# Patient Record
Sex: Male | Born: 1960 | Race: White | Hispanic: No | Marital: Married | Smoking: Never smoker
Health system: Southern US, Community
[De-identification: ages and names within clinical notes are randomized; demographics above are authoritative.]

## PROBLEM LIST (undated history)

## (undated) DIAGNOSIS — K259 Gastric ulcer, unspecified as acute or chronic, without hemorrhage or perforation: Secondary | ICD-10-CM

## (undated) DIAGNOSIS — I10 Essential (primary) hypertension: Secondary | ICD-10-CM

## (undated) DIAGNOSIS — R51 Headache: Secondary | ICD-10-CM

## (undated) DIAGNOSIS — K219 Gastro-esophageal reflux disease without esophagitis: Secondary | ICD-10-CM

## (undated) DIAGNOSIS — G8929 Other chronic pain: Secondary | ICD-10-CM

## (undated) DIAGNOSIS — G709 Myoneural disorder, unspecified: Secondary | ICD-10-CM

## (undated) DIAGNOSIS — F419 Anxiety disorder, unspecified: Secondary | ICD-10-CM

## (undated) DIAGNOSIS — K2970 Gastritis, unspecified, without bleeding: Secondary | ICD-10-CM

## (undated) DIAGNOSIS — M1990 Unspecified osteoarthritis, unspecified site: Secondary | ICD-10-CM

## (undated) DIAGNOSIS — N200 Calculus of kidney: Secondary | ICD-10-CM

## (undated) HISTORY — DX: Gastric ulcer, unspecified as acute or chronic, without hemorrhage or perforation: K25.9

## (undated) HISTORY — DX: Unspecified osteoarthritis, unspecified site: M19.90

## (undated) HISTORY — DX: Other chronic pain: G89.29

## (undated) HISTORY — DX: Gastritis, unspecified, without bleeding: K29.70

## (undated) HISTORY — DX: Gastro-esophageal reflux disease without esophagitis: K21.9

## (undated) HISTORY — PX: LAMINECTOMY: SHX219

## (undated) HISTORY — DX: Headache: R51

## (undated) HISTORY — DX: Calculus of kidney: N20.0

## (undated) HISTORY — DX: Anxiety disorder, unspecified: F41.9

## (undated) HISTORY — DX: Myoneural disorder, unspecified: G70.9

## (undated) HISTORY — DX: Essential (primary) hypertension: I10

---

## 1999-10-19 ENCOUNTER — Encounter: Admission: RE | Admit: 1999-10-19 | Discharge: 1999-10-19 | Payer: Self-pay | Admitting: Sports Medicine

## 2007-08-28 ENCOUNTER — Ambulatory Visit: Payer: Self-pay | Admitting: Internal Medicine

## 2008-03-08 ENCOUNTER — Ambulatory Visit: Payer: Self-pay | Admitting: Internal Medicine

## 2008-03-14 ENCOUNTER — Ambulatory Visit: Payer: Self-pay | Admitting: Internal Medicine

## 2008-03-14 ENCOUNTER — Encounter: Payer: Self-pay | Admitting: Internal Medicine

## 2008-03-18 ENCOUNTER — Encounter: Payer: Self-pay | Admitting: Internal Medicine

## 2010-12-01 NOTE — Assessment & Plan Note (Signed)
[facility]                         GASTROENTEROLOGY OFFICE NOTE   NAME:Pender, LORI LIEW                 MRN:          [id number]  DATE:08/28/2007                            DOB:          04-Oct-1960    Mr. Faucett is a 50 year old gentleman whom we saw in the past for  evaluation of gastric ulcer in 1979, and family history of colon cancer  in his grandfather and great-grandfather, as well as esophageal cancer  in his father who was our patient.  Mr. Chicoine has intermittent rectal  bleeding.  He has seen bright red blood on the tissue intermittently  without any significant rectal pain.  His upper endoscopy in September  2004 was normal.  Biopsies of the G junction showed no evidence of  Barrett's esophagus.  He was treated with PPIs at that time, which he  has since stopped taking because there are no symptoms.  As far as his  colonoscopy is concerned, this was a normal exam without any polyps.  I  did not at that time comment on the presence of any anal fissure or  hemorrhoids.  His bowel habits have been regular.  He denies use of  laxatives.  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  This results in him sitting  quite a lot.  His weight has been stable.   MEDICATIONS:  Multiple vit mains.   PAST MEDICAL HISTORY:  Chronic headaches.   OPERATIONS:  Laminectomy and foraminectomy in 2007 and in 1990.   FAMILY HISTORY:  Positive for colon cancer as mentioned above, heart  disease and diabetes.   SOCIAL HISTORY:  Married with two children.  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  He does not smoke and drinks alcohol  only occasionally.   REVIEW OF SYSTEMS:  Positive for arthritic complaints, sleeping  problems, back pain.   PHYSICAL EXAMINATION:  Blood pressure 100/70, pulse 60 and weight  197  pounds.  He was alert, oriented, in no distress.  LUNGS:  Clear to auscultation  COR:  Normal S1, S2.  ABDOMEN:  Soft, nontender with normoactive bowel sounds.  RECTAL:  Anoscopic exam shows normal perineal area.  He had normal  rectal tone and first grade internal hemorrhoids.  One of them was  bluish in discoloration.  Two of them  were rather small, but active.  There was no prolapse.  Stool was Hemoccult negative.   IMPRESSION:  [age]-year-old white male with symptomatic first grade  hemorrhoids, which are all internal.  He has a strong family history of  colorectal neoplasm, and had appropriate screening 4-1/2 years ago.  He  is due for repeat colonoscopy in September 2009.  We discussed treatment  of the hemorrhoids, which undoubtedly are affected by the fact that he  drives long distances.  I suggested using a pillow or special seat, or  getting out of the car at regular intervals to improve his circulation  around the rectal area.  We will be  using Anusol HC suppositories  intermittently.  He is due for colonoscopy in September 2009.  If at  that  time his hemorrhoids still bother him and keep bleeding, we will  consider banding them during the colonoscopic exam.     Hedwig Morton. Juanda Chance, MD  Electronically Signed    DMB/MedQ  DD: 08/28/2007  DT: 08/28/2007  Job #: [id number]   cc:   Foye Deer, M.D.

## 2012-04-07 ENCOUNTER — Emergency Department: Payer: Self-pay | Admitting: Internal Medicine

## 2012-04-07 LAB — COMPREHENSIVE METABOLIC PANEL
Albumin: 4.1 g/dL (ref 3.4–5.0)
Anion Gap: 5 — ABNORMAL LOW (ref 7–16)
BUN: 19 mg/dL — ABNORMAL HIGH (ref 7–18)
Bilirubin,Total: 1.4 mg/dL — ABNORMAL HIGH (ref 0.2–1.0)
Chloride: 105 mmol/L (ref 98–107)
Creatinine: 0.94 mg/dL (ref 0.60–1.30)
EGFR (African American): >60
Glucose: 95 mg/dL (ref 65–99)
SGOT(AST): 18 U/L (ref 15–37)
Sodium: 140 mmol/L (ref 136–145)
Total Protein: 7.4 g/dL (ref 6.4–8.2)

## 2012-04-07 LAB — TROPONIN I: Troponin-I: <0.02 ng/mL

## 2012-04-07 LAB — CBC: HGB: 15.1 g/dL (ref 13.0–18.0)

## 2012-04-07 LAB — TSH: Thyroid Stimulating Horm: 2.51 u[IU]/mL

## 2012-12-20 ENCOUNTER — Telehealth: Payer: Self-pay | Admitting: Internal Medicine

## 2012-12-20 NOTE — Telephone Encounter (Signed)
Left a message for patient to call me. 

## 2012-12-21 NOTE — Telephone Encounter (Signed)
Spoke with patient and he is wanting to schedule ECOL. Asked patient if he is having problems. He states he is having esophageal discomfort off and on. Also reports a history of hemorrhoids and rectal bleeding.He is not having increase in problems in this area. He is anxious due to family history of esophageal cancer and colon cancer. He travels a lot and wants to schedule OV to discuss. Scheduled OV with Dr. Juanda Chance on 01/05/13 at 3:00 PM.

## 2013-01-05 ENCOUNTER — Encounter: Payer: Self-pay | Admitting: Internal Medicine

## 2013-01-05 ENCOUNTER — Ambulatory Visit (INDEPENDENT_AMBULATORY_CARE_PROVIDER_SITE_OTHER): Payer: BC Managed Care – PPO | Admitting: Internal Medicine

## 2013-01-05 VITALS — BP 110/80 | HR 80 | Ht 68.0 in | Wt 211.1 lb

## 2013-01-05 DIAGNOSIS — R1013 Epigastric pain: Secondary | ICD-10-CM

## 2013-01-05 DIAGNOSIS — K625 Hemorrhage of anus and rectum: Secondary | ICD-10-CM

## 2013-01-05 MED ORDER — PEG-KCL-NACL-NASULF-NA ASC-C 100 G PO SOLR: 1.0000 | Freq: Once | ORAL | Status: DC

## 2013-01-05 MED ORDER — HYDROCORTISONE ACETATE 25 MG RE SUPP: 25.0000 mg | Freq: Two times a day (BID) | RECTAL | Status: DC

## 2013-01-05 MED ORDER — ESOMEPRAZOLE MAGNESIUM 40 MG PO CPDR: 40.0000 mg | DELAYED_RELEASE_CAPSULE | Freq: Every day | ORAL | Status: DC

## 2013-01-05 NOTE — Patient Instructions (Addendum)
You have been given a separate informational sheet regarding your tobacco use, the importance of quitting and local resources to help you quit.  You have been scheduled for an endoscopy and colonoscopy with propofol. Please follow the written instructions given to you at your visit today. Please pick up your prep at the pharmacy within the next 1-3 days. If you use inhalers (even only as needed), please bring them with you on the day of your procedure. Your physician has requested that you go to www.startemmi.com and enter the access code given to you at your visit today. This web site gives a general overview about your procedure. However, you should still follow specific instructions given to you by our office regarding your preparation for the procedure.  We have sent the following medications to your pharmacy for you to pick up at your convenience: Nexium and Anusol suppositories.  Cc: Dalbert Garnet, MD

## 2013-01-05 NOTE — Progress Notes (Signed)
Terry Perez 13-Nov-1960 MRN [id number]        History of Present Illness:  This is a 52 year old white male with chronic gastroesophageal reflux, currently not treated. And symptomatic hemorrhoids. We have seen him in the past for gastroesophageal reflux and  family history of colon cancer. In August 2009 he underwent upper endoscopy to evaluate cough and history of gastric ulcer from 1979. His father had the Barrett's esophagus and died of esophageal cancer. Marland Kitchen He also had a gastritis which on biopsy showed  chronic gastritis H. pylori negative. In August 2009 he also underwent colonoscopy. This was his second exam the first one was from 2004. He is a family history of colon cancer in grandparent and great grandparent. He sees occasional rectal bleeding but his bowel habits have been regular. He drives a long distances in a car up to several hundred miles a day which aggravates his hemorrhoids.    Past Medical History  Diagnosis Date  . Gastric ulcer   . Chronic headaches   . Gastritis   . Arthritis   . Kidney stones   . HTN (hypertension)    Past Surgical History  Procedure Laterality Date  . Laminectomy  2007, 1990    reports that he has never smoked. His smokeless tobacco use includes Snuff. He reports that he does not drink alcohol or use illicit drugs. family history includes Colon cancer in his maternal grandfather; Diabetes in his mother; Esophageal cancer in his father; Heart disease in his father; and Stroke in his paternal grandmother. No Known Allergies      Review of Systems: Denies dysphagia odynophagia. Positive for chest pain and epigastric discomfort  The remainder of the 10 point ROS is negative except as outlined in H&P   Physical Exam: General appearance  Well developed, in no distress. Mildly overweight Eyes- non icteric. HEENT nontraumatic, normocephalic. Mouth no lesions, tongue papillated, no cheilosis. Neck supple without adenopathy, thyroid  not enlarged, no carotid bruits, no JVD. Lungs Clear to auscultation bilaterally. Cor normal S1, normal S2, regular rhythm, no murmur,  quiet precordium. Abdomen: Tender in epigastrium and above the umbilicus in the midline. Normal active bowel sounds. No distention normal left lower and right lower quadrants. No palpable mass. Liver edge at costal margin Rectal: Normal perianal area. Normal rectal sphincter tone. Settle first grade internal hemorrhoids which are edematous hyperemic and appeared to be inflamed. There is no thrombosis. Stool is Hemoccult negative Extremities no pedal edema. Skin no lesions. Neurological alert and oriented x 3. Psychological normal mood and affect.  Assessment and Plan:  52 year old white male with chronic gastroesophageal reflux and family history of Barrett's esophagus and gastric cancer. He needs to be back on acid reducing agent. He like Nexium 40 mg daily. Antireflux measures including weight loss. We will schedule for upper endoscopy and biopsies  Symptomatic first grade hemorrhoids. Will start Anusol-HC suppositories each bedtime  Family history of colorectal cancer in several relatives. We will schedule for screening colonoscopy because of rectal bleeding   01/05/2013 Terry Perez

## 2013-01-08 ENCOUNTER — Encounter: Payer: Self-pay | Admitting: Internal Medicine

## 2013-02-14 ENCOUNTER — Ambulatory Visit (AMBULATORY_SURGERY_CENTER): Payer: BC Managed Care – PPO | Admitting: Internal Medicine

## 2013-02-14 ENCOUNTER — Encounter: Payer: Self-pay | Admitting: Internal Medicine

## 2013-02-14 VITALS — BP 115/79 | HR 62 | Temp 98.3°F | Resp 63 | Ht 68.0 in | Wt 197.0 lb

## 2013-02-14 DIAGNOSIS — Z800 Family history of malignant neoplasm of digestive organs: Secondary | ICD-10-CM

## 2013-02-14 DIAGNOSIS — K2970 Gastritis, unspecified, without bleeding: Secondary | ICD-10-CM

## 2013-02-14 DIAGNOSIS — R1013 Epigastric pain: Secondary | ICD-10-CM

## 2013-02-14 DIAGNOSIS — Z1211 Encounter for screening for malignant neoplasm of colon: Secondary | ICD-10-CM

## 2013-02-14 DIAGNOSIS — K2960 Other gastritis without bleeding: Secondary | ICD-10-CM

## 2013-02-14 DIAGNOSIS — K209 Esophagitis, unspecified without bleeding: Secondary | ICD-10-CM

## 2013-02-14 DIAGNOSIS — K2990 Gastroduodenitis, unspecified, without bleeding: Secondary | ICD-10-CM

## 2013-02-14 DIAGNOSIS — K625 Hemorrhage of anus and rectum: Secondary | ICD-10-CM

## 2013-02-14 MED ORDER — SODIUM CHLORIDE 0.9 % IV SOLN: 500.0000 mL | INTRAVENOUS | Status: DC

## 2013-02-14 NOTE — Op Note (Addendum)
[facility] [address] [place], [address]   COLONOSCOPY PROCEDURE REPORT  PATIENT: Terry Perez, Terry Perez  MR#: [id number] BIRTHDATE: Nov 23, 1960 , 52  yrs. old GENDER: Male ENDOSCOPIST: Hart Carwin, MD REFERRED [id number] Tomasa Blase, M.D. PROCEDURE DATE:  02/14/2013 PROCEDURE:   Colonoscopy, screening First Screening Colonoscopy - Avg.  risk and is 50 yrs.  old or older - No.  Prior Negative Screening - Now for repeat screening. Other: See Comments         no ASA CLASS:   Class II INDICATIONS:father with gastric cancer, from Barrett's, PGF had colon cancer, pt is having low volume hematochezia.  Last colonosobpy in 2009 ( and in 2004). MEDICATIONS: MAC sedation, administered by CRNA and Propofol (Diprivan) 150 mg  DESCRIPTION OF PROCEDURE:   After the risks benefits and alternatives of the procedure were thoroughly explained, informed consent was obtained.  A digital rectal exam revealed no abnormalities of the rectum.   The LB [id number]  endoscope was introduced through the anus and advanced to the cecum, which was identified by both the appendix and ileocecal valve. No adverse events experienced.   The quality of the prep was excellent, using MoviPrep  The instrument was then slowly withdrawn as the colon was fully examined.      COLON FINDINGS: A normal appearing cecum, ileocecal valve, and appendiceal orifice were identified.  The ascending, hepatic flexure, transverse, splenic flexure, descending, sigmoid colon and rectum appeared unremarkable.  No polyps or cancers were seen. Retroflexed views revealed no abnormalities. The time to cecum=  . Withdrawal time=  .  The scope was withdrawn and the procedure completed. COMPLICATIONS: There were no complications.  ENDOSCOPIC IMPRESSION: Normal colon to the cecum, low volume hematochezia likely due to anaorectal source  RECOMMENDATIONS: High fiber diet   eSigned:  Hart Carwin, MD  02/14/2013 2:41 PM   cc:

## 2013-02-14 NOTE — Patient Instructions (Addendum)

## 2013-02-14 NOTE — Progress Notes (Signed)
Called to room to assist during endoscopic procedure.  Patient ID and intended procedure confirmed with present staff. Received instructions for my participation in the procedure from the performing physician.  

## 2013-02-14 NOTE — Op Note (Signed)
[facility] [address] [place], [address]   ENDOSCOPY PROCEDURE REPORT  PATIENT: Terry Perez, Terry Perez  MR#: [id number] BIRTHDATE: [birth year] , 52  yrs. old GENDER: Male ENDOSCOPIST: Hart Carwin, MD REFERRED BY:  Foye Deer, M.D. PROCEDURE DATE:  02/14/2013 PROCEDURE:  EGD w/ biopsy ASA CLASS:     Class II INDICATIONS:  Epigastric pain.   father with Barrett's esophagus/ adenocarcinoma, , pt had gastric ulcer in 1979,, last EGD 02/2008- normal. MEDICATIONS: MAC sedation, administered by CRNA and propofol (Diprivan) 200mg  IV TOPICAL ANESTHETIC: none  DESCRIPTION OF PROCEDURE: After the risks benefits and alternatives of the procedure were thoroughly explained, informed consent was obtained.  The LB [id number] endoscope was introduced through the mouth and advanced to the second portion of the duodenum. Without limitations.  The instrument was slowly withdrawn as the mucosa was fully examined.      esophagus: Esophageal mucosa appeared normal in proximal , mid and distal esophagus. There were no erosions or evidence of active esophagitis. The Z line appeared normal. There was no stricture or hiatal hernia. Biopsies were taken from the Z line to rule out metaplasia Stomach: There was coffee-ground material coating gastric mucosa and a few scattered erosions throughout the stomach but no ulceration rugal pattern was normal. Biopsies from the gastric antrum were taken to rule out H. pylori. Retroflexion of the endoscope revealed normal fundus and cardia. The pyloric outlet was normal there was no retained food Duodenum: Duodenal bulb and descending duodenum appeared normal[          The scope was then withdrawn from the patient and the procedure completed.  COMPLICATIONS: There were no complications. ENDOSCOPIC IMPRESSION:  mild-to-moderate gastritis status post biopsies to rule out H. pylori Normal gastroesophageal junction status post  biopsies to rule out metaplasia. RECOMMENDATIONS: Anti-reflux regimen to be follow continue PPI daily Date results of pathology  REPEAT EXAM: no  eSigned:  Hart Carwin, MD 02/14/2013 2:34 PM   CC:  PATIENT NAME:  Terry Perez, Terry Perez MR#: [id number]

## 2013-02-14 NOTE — Progress Notes (Signed)
Lidocaine-40mg IV prior to Propofol InductionPropofol given over incremental dosages 

## 2013-02-14 NOTE — Progress Notes (Signed)
Patient did not experience any of the following events: a burn prior to discharge; a fall within the facility; wrong site/side/patient/procedure/implant event; or a hospital transfer or hospital admission upon discharge from the facility. (G8907) Patient did not have preoperative order for IV antibiotic SSI prophylaxis. (G8918)  

## 2013-02-15 ENCOUNTER — Telehealth: Payer: Self-pay | Admitting: *Deleted

## 2013-02-15 NOTE — Telephone Encounter (Signed)
No answer, message left for the patient. 

## 2013-02-21 ENCOUNTER — Encounter: Payer: Self-pay | Admitting: Internal Medicine

## 2014-01-22 IMAGING — CR DG CHEST 2V
1 series · 2 of 2 positions shown · non-contrast
Comparison: none

REASON FOR EXAM: palpitations recent pneumothorax
COMMENTS:

[Series 1: w chest pa · 0.14mm/px · 2 of 2 slices shown]
[im 1/2]
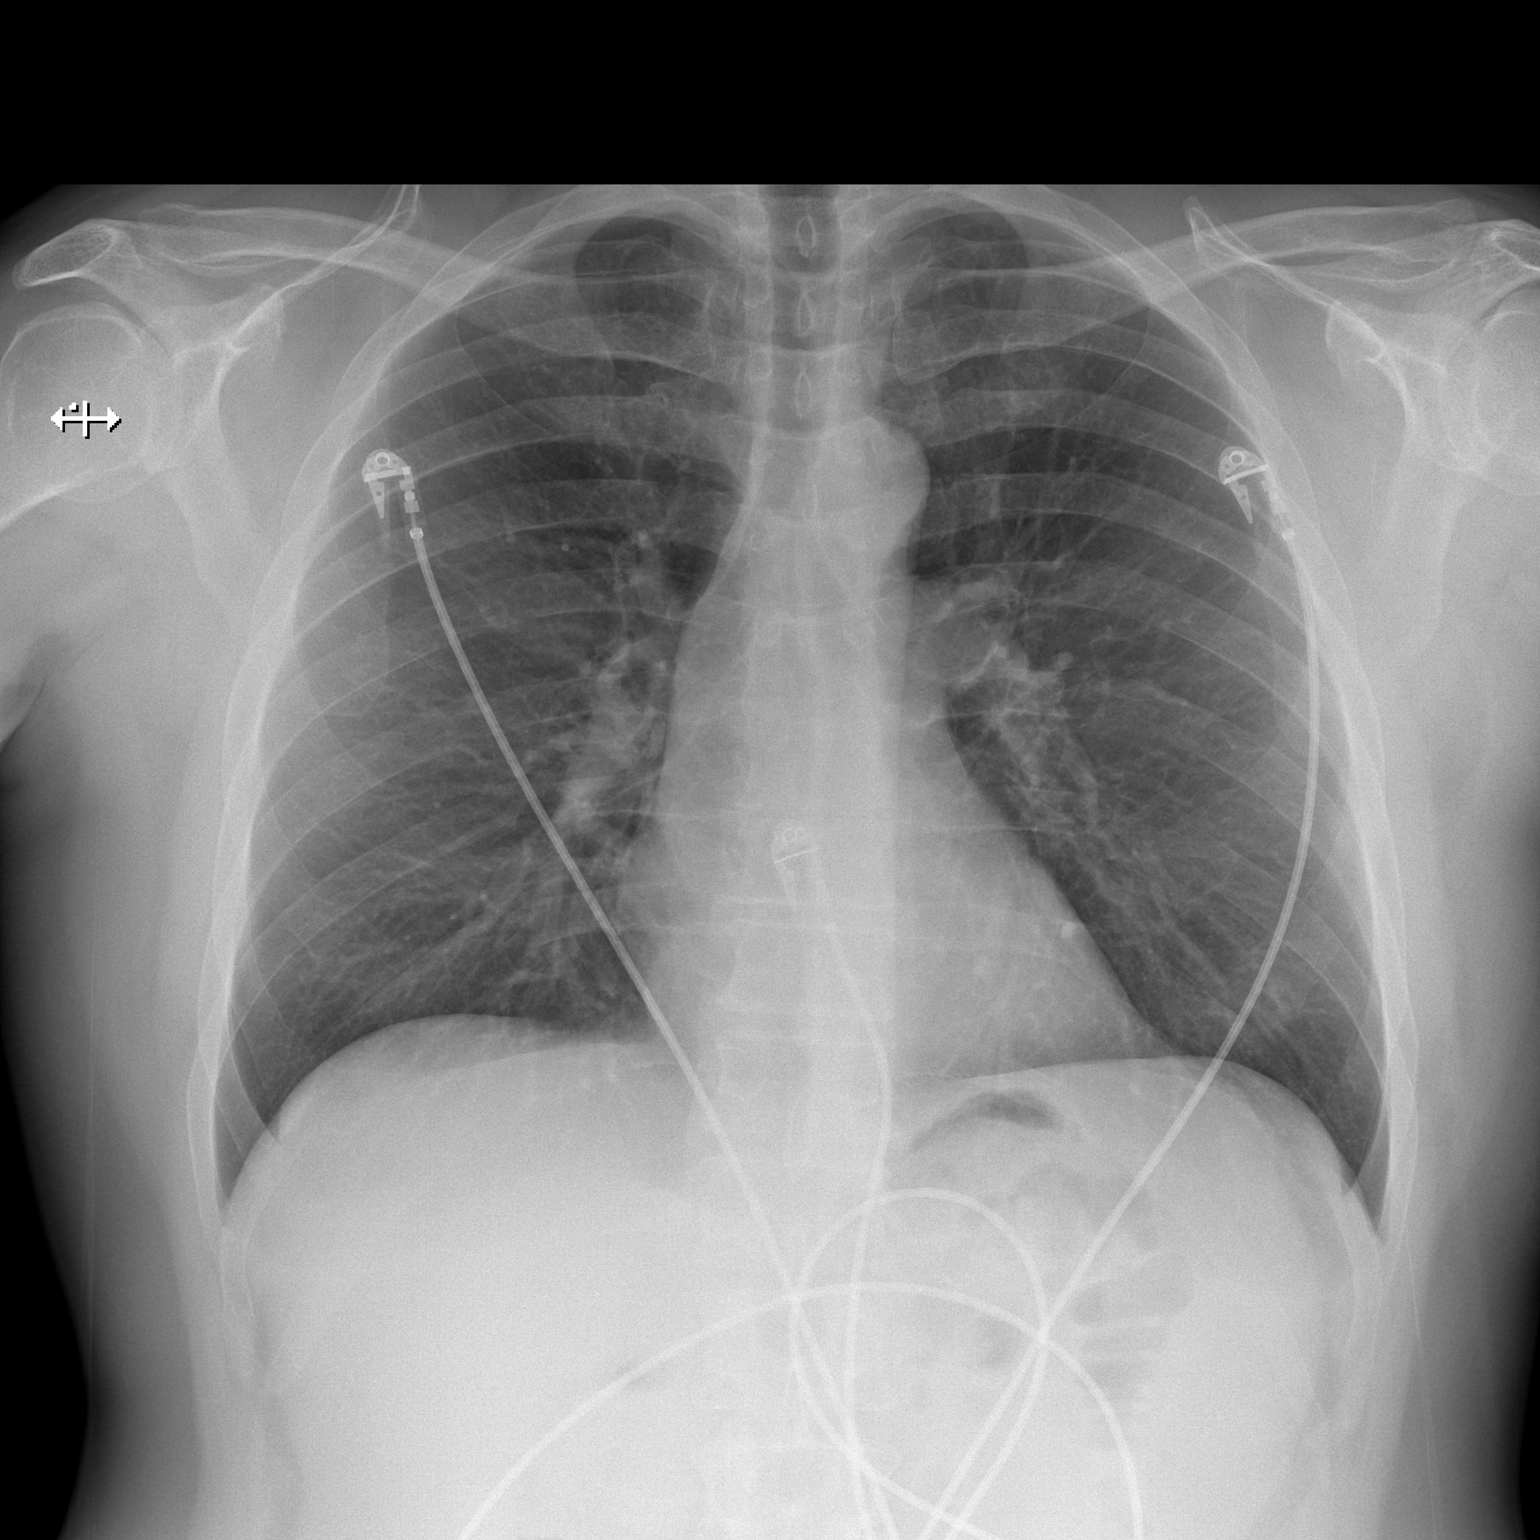
[im 2/2]
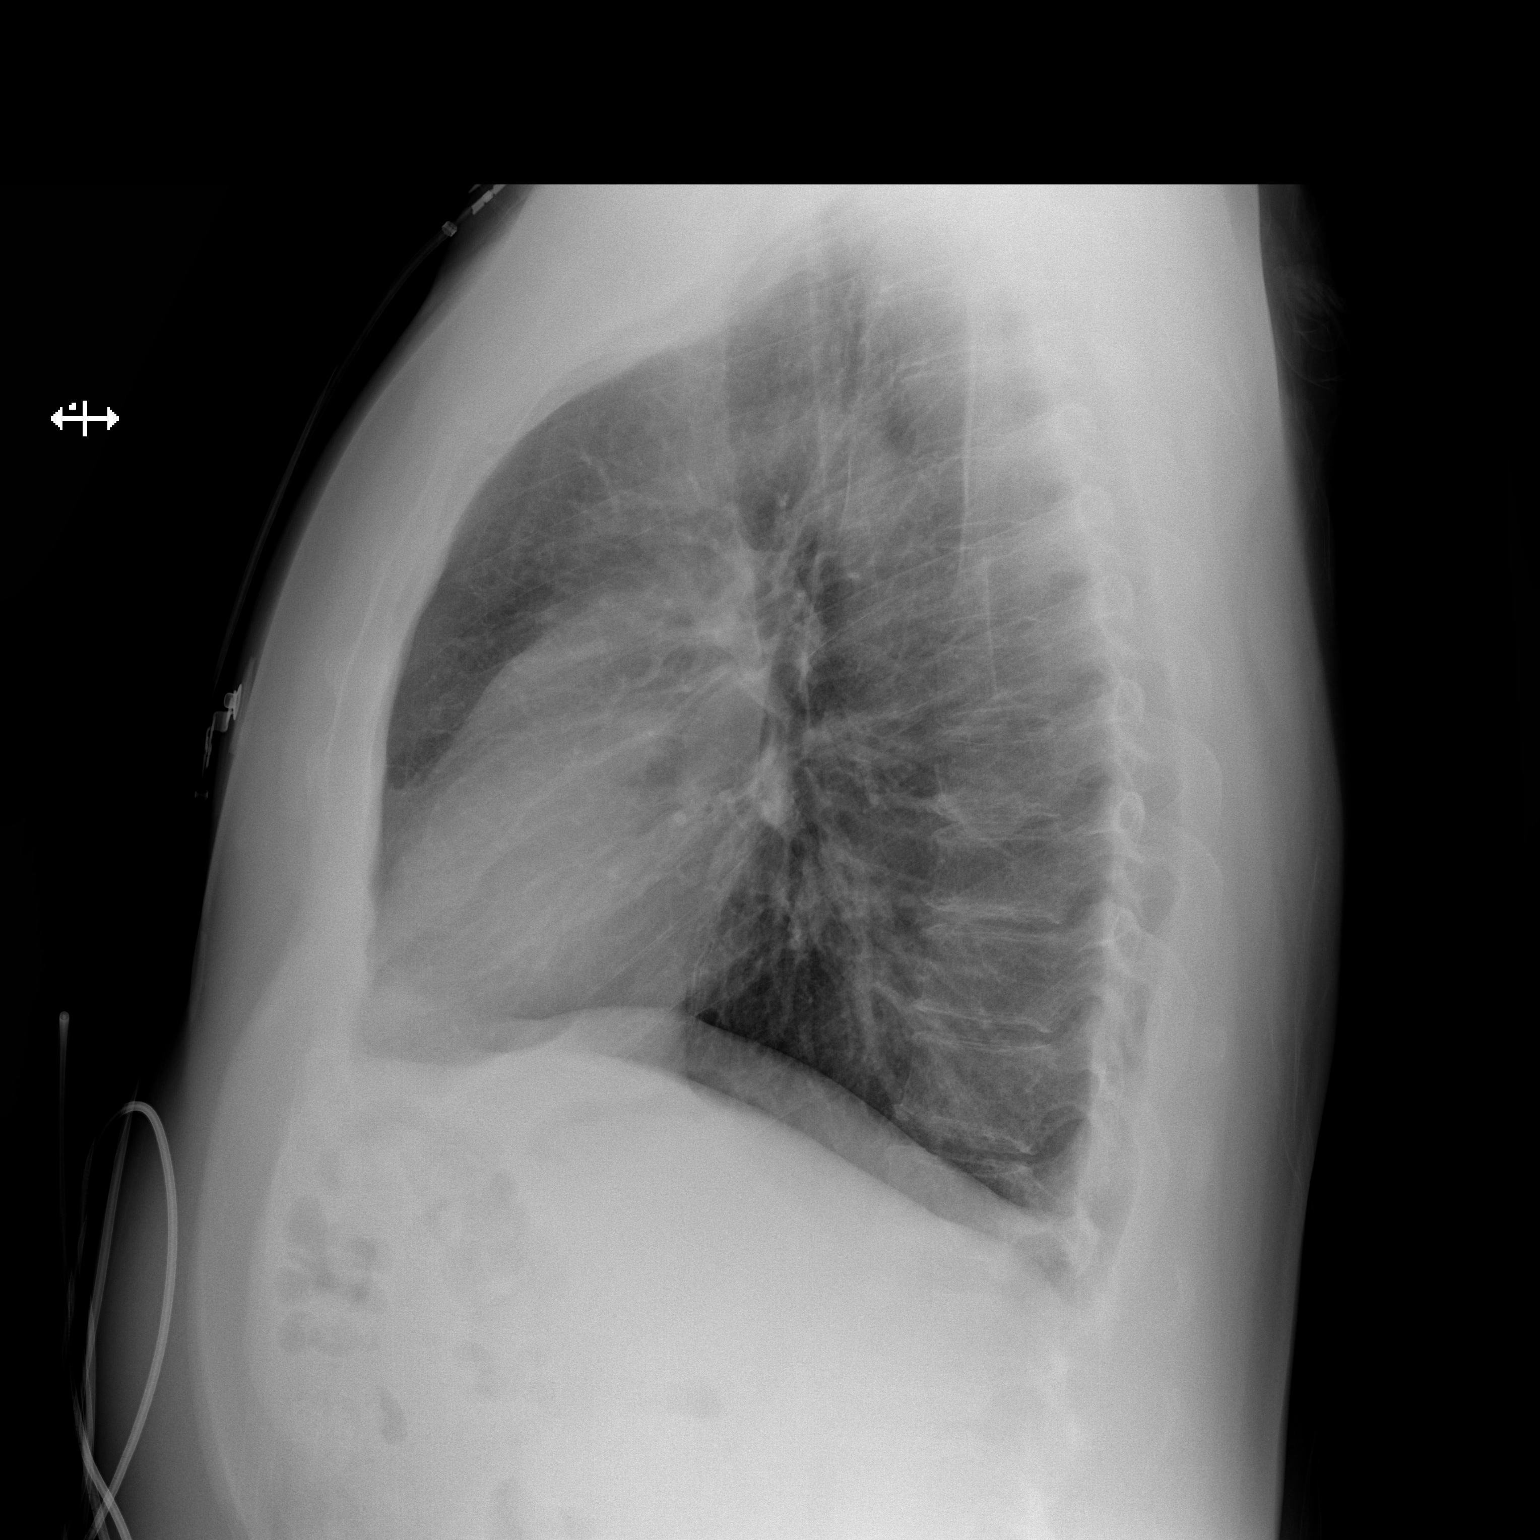

[2 of 2 positions shown; findings below may reference images not displayed]

PROCEDURE:     DXR - DXR CHEST PA (OR AP) AND LATERAL  - April 07, 2012  [DATE]

RESULT:     The lungs are well-expanded and clear. There is no evidence of a
pneumothorax nor pneumomediastinum. No pulmonary contusion is evident. There
is no evidence of a pleural effusion. The cardiac silhouette is normal in
size. The pulmonary vascularity is not engorged. The bony thorax exhibits no
acute abnormality. Reportedly the patient sustained recent rib fractures and
a pneumothorax but definite evidence of healing fractures is not
demonstrated.
IMPRESSION: I do not see evidence of a recurrent or residual
pneumothorax nor other acute cardiopulmonary abnormality.

[REDACTED]

## 2021-05-06 ENCOUNTER — Telehealth: Payer: Self-pay | Admitting: Pulmonary Disease

## 2021-05-06 NOTE — Telephone Encounter (Signed)
Patient scheduled 05/19/21 with Dr. Wynona Neat for sleep consult.  Papers received via fax from Dr. Tomasa Blase.  Papers stating Patient having witnessed apneas, feeling tired for 1 year. LM with Patient asking if he has ever had any previous sleep studies, and if yes, where, when.

## 2021-05-12 NOTE — Telephone Encounter (Addendum)
ATC Patient to ask about previous sleep studies. LM to call back.

## 2021-05-19 ENCOUNTER — Other Ambulatory Visit: Payer: Self-pay

## 2021-05-19 ENCOUNTER — Encounter: Payer: Self-pay | Admitting: Pulmonary Disease

## 2021-05-19 ENCOUNTER — Ambulatory Visit (INDEPENDENT_AMBULATORY_CARE_PROVIDER_SITE_OTHER): Payer: BC Managed Care – PPO | Admitting: Pulmonary Disease

## 2021-05-19 VITALS — BP 118/78 | HR 85 | Temp 99.0°F | Ht 68.0 in | Wt 209.0 lb

## 2021-05-19 DIAGNOSIS — G4733 Obstructive sleep apnea (adult) (pediatric): Secondary | ICD-10-CM | POA: Diagnosis not present

## 2021-05-19 NOTE — Patient Instructions (Signed)
Schedule for home sleep study  Tentative follow-up in about 4 months  Continue weight loss efforts  Call with significant concerns  Sleep Apnea Sleep apnea affects breathing during sleep. It causes breathing to stop for 10 seconds or more, or to become shallow. People with sleep apnea usually snore loudly. It can also increase the risk of: Heart attack. Stroke. Being very overweight (obese). Diabetes. Heart failure. Irregular heartbeat. High blood pressure. The goal of treatment is to help you breathe normally again. What are the causes? The most common cause of this condition is a collapsed or blocked airway. There are three kinds of sleep apnea: Obstructive sleep apnea. This is caused by a blocked or collapsed airway. Central sleep apnea. This happens when the brain does not send the right signals to the muscles that control breathing. Mixed sleep apnea. This is a combination of obstructive and central sleep apnea. What increases the risk? Being overweight. Smoking. Having a small airway. Being older. Being male. Drinking alcohol. Taking medicines to calm yourself (sedatives or tranquilizers). Having family members with the condition. Having a tongue or tonsils that are larger than normal. What are the signs or symptoms? Trouble staying asleep. Loud snoring. Headaches in the morning. Waking up gasping. Dry mouth or sore throat in the morning. Being sleepy or tired during the day. If you are sleepy or tired during the day, you may also: Not be able to focus your mind (concentrate). Forget things. Get angry a lot and have mood swings. Feel sad (depressed). Have changes in your personality. Have less interest in sex, if you are male. Be unable to have an erection, if you are male. How is this treated?  Sleeping on your side. Using a medicine to get rid of mucus in your nose (decongestant). Avoiding the use of alcohol, medicines to help you relax, or certain pain  medicines (narcotics). Losing weight, if needed. Changing your diet. Quitting smoking. Using a machine to open your airway while you sleep, such as: An oral appliance. This is a mouthpiece that shifts your lower jaw forward. A CPAP device. This device blows air through a mask when you breathe out (exhale). An EPAP device. This has valves that you put in each nostril. A BPAP device. This device blows air through a mask when you breathe in (inhale) and breathe out. Having surgery if other treatments do not work. Follow these instructions at home: Lifestyle Make changes that your doctor recommends. Eat a healthy diet. Lose weight if needed. Avoid alcohol, medicines to help you relax, and some pain medicines. Do not smoke or use any products that contain nicotine or tobacco. If you need help quitting, ask your doctor. General instructions Take over-the-counter and prescription medicines only as told by your doctor. If you were given a machine to use while you sleep, use it only as told by your doctor. If you are having surgery, make sure to tell your doctor you have sleep apnea. You may need to bring your device with you. Keep all follow-up visits. Contact a doctor if: The machine that you were given to use during sleep bothers you or does not seem to be working. You do not get better. You get worse. Get help right away if: Your chest hurts. You have trouble breathing in enough air. You have an uncomfortable feeling in your back, arms, or stomach. You have trouble talking. One side of your body feels weak. A part of your face is hanging down. These symptoms may be  an emergency. Get help right away. Call your local emergency services (911 in the [place] Do not wait to see if the symptoms will go away. Do not drive yourself to the hospital. Summary This condition affects breathing during sleep. The most common cause is a collapsed or blocked airway. The goal of treatment is to help  you breathe normally while you sleep. This information is not intended to replace advice given to you by your health care provider. Make sure you discuss any questions you have with your health care provider. Document Revised: 06/13/2020 Document Reviewed: 06/13/2020 Elsevier Patient Education  2022 [employer].

## 2021-05-19 NOTE — Progress Notes (Signed)
Terry Perez    [id number]    06/13/1961  Primary Care Physician:Schultz, Jonetta Speak, MD  Referring Physician: Paulina Fusi, MD [address] [place],  [place] [address]  Chief complaint:   Patient with history of obstructive sleep apnea Daytime fatigue  HPI:  Had a sleep study done 2007 years ago showing mild obstructive sleep apnea  Was not started on any treatment at the time  Is fatigued all the time Usually goes to bed between 930 and 10 Takes him about an hour to fall asleep 1-2 awakenings  Final wake up time about 5:15 AM  Weight is stable  Denies any dryness of his mouth in the mornings No sore throat No night sweats Occasional headaches Memory is good  No sleepy driving  Usually takes caffeinated beverages during the day to keep him awake and alert  Dad snored heavy Smokeless tobacco in the past    Outpatient Encounter Medications as of 05/19/2021  Medication Sig   allopurinol (ZYLOPRIM) 300 MG tablet Take 300 mg by mouth daily.   aspirin 81 MG tablet Take 81 mg by mouth daily.   b complex vitamins capsule Take 1 capsule by mouth daily.   budesonide-formoterol (SYMBICORT) 160-4.5 MCG/ACT inhaler Inhale 2 puffs into the lungs 2 (two) times daily.   calcium carbonate (TUMS EX) 750 MG chewable tablet Chew 1 tablet by mouth as needed for heartburn.   lisinopril-hydrochlorothiazide (ZESTORETIC) 20-12.5 MG tablet Take 1 tablet by mouth daily.   Multiple Vitamin (MULTIVITAMIN) tablet Take 1 tablet by mouth daily.   [DISCONTINUED] aliskiren (TEKTURNA) 300 MG tablet Take 300 mg by mouth daily. (Patient not taking: Reported on 05/19/2021)   [DISCONTINUED] diclofenac (CATAFLAM) 50 MG tablet Take by mouth. (Patient not taking: Reported on 05/19/2021)   [DISCONTINUED] diclofenac (VOLTAREN) 50 MG EC tablet Take 50 mg by mouth as needed. (Patient not taking: Reported on 05/19/2021)   [DISCONTINUED] esomeprazole (NEXIUM) 40 MG  capsule Take 1 capsule (40 mg total) by mouth daily before breakfast. (Patient not taking: Reported on 05/19/2021)   [DISCONTINUED] HYDROcodone-acetaminophen (NORCO/VICODIN) 5-325 MG per tablet Take 1 tablet by mouth as needed for pain. (Patient not taking: Reported on 05/19/2021)   [DISCONTINUED] hydrocortisone (ANUSOL-HC) 25 MG suppository Place 1 suppository (25 mg total) rectally every 12 (twelve) hours. (Patient not taking: Reported on 05/19/2021)   [DISCONTINUED] levalbuterol (XOPENEX HFA) 45 MCG/ACT inhaler Inhale into the lungs. (Patient not taking: Reported on 05/19/2021)   No facility-administered encounter medications on file as of 05/19/2021.    Allergies as of 05/19/2021   (No Known Allergies)    Past Medical History:  Diagnosis Date   Anxiety    Arthritis    Chronic headaches    Gastric ulcer    Gastritis    GERD (gastroesophageal reflux disease)    HTN (hypertension)    Kidney stones    Neuromuscular disorder (HCC)    disc disease    Past Surgical History:  Procedure Laterality Date   LAMINECTOMY  2007, 1990    Family History  Problem Relation Age of Onset   Colon cancer Maternal Grandfather    Esophageal cancer Father    Heart disease Father    Diabetes Mother    Stroke Paternal Grandmother    Rectal cancer Neg Hx    Stomach cancer Neg Hx     Social History   Socioeconomic History   Marital status: Married    Spouse name:  Not on file   Number of children: 2   Years of education: Not on file   Highest education level: Not on file  Occupational History   Occupation: Arts development officer: [employer]  Tobacco Use   Smoking status: Never   Smokeless tobacco: Current    Types: Snuff  Substance and Sexual Activity   Alcohol use: No   Drug use: No   Sexual activity: Not on file  Other Topics Concern   Not on file  Social History Narrative   Not on file   Social Determinants of Health   Financial Resource Strain: Not on file  Food Insecurity:  Not on file  Transportation Needs: Not on file  Physical Activity: Not on file  Stress: Not on file  Social Connections: Not on file  Intimate Partner Violence: Not on file    Review of Systems  Constitutional:  Positive for fatigue.  Respiratory:  Positive for apnea.   Psychiatric/Behavioral:  Positive for sleep disturbance.    Vitals:   05/19/21 0935  BP: 118/78  Pulse: 85  Temp: 99 F (37.2 C)  SpO2: 97%     Physical Exam Constitutional:      Appearance: Normal appearance.  HENT:     Head: Normocephalic.     Nose: Nose normal.     Mouth/Throat:     Mouth: Mucous membranes are moist.  Cardiovascular:     Rate and Rhythm: Normal rate and regular rhythm.     Heart sounds: No murmur heard.   No friction rub.  Pulmonary:     Effort: No respiratory distress.     Breath sounds: No stridor. No wheezing or rhonchi.  Neurological:     Mental Status: He is alert.  Psychiatric:        Mood and Affect: Mood normal.   Results of the Epworth flowsheet 05/19/2021  Sitting and reading 1  Watching TV 0  Sitting, inactive in a public place (e.g. a theatre or a meeting) 1  As a passenger in a car for an hour without a break 2  Lying down to rest in the afternoon when circumstances permit 3  Sitting and talking to someone 0  Sitting quietly after a lunch without alcohol 0  In a car, while stopped for a few minutes in traffic 0  Total score 7   Data Reviewed: Previous study from 2007 had shown mild obstructive sleep apnea  Assessment:  Daytime fatigue  Daytime sleepiness  Nonrestorative sleep  Moderate probability of worsening of his sleep apnea over the years  Pathophysiology of sleep disordered breathing discussed Treatment options discussed  Plan/Recommendations: Schedule patient for home sleep study  We will update him with results as soon as reviewed  Tentative follow-up in 3 -4 months   Virl Diamond MD [facility] 05/19/2021,  10:13 AM  CC: Paulina Fusi, MD

## 2021-11-02 ENCOUNTER — Ambulatory Visit: Payer: BC Managed Care – PPO

## 2021-11-02 DIAGNOSIS — G4733 Obstructive sleep apnea (adult) (pediatric): Secondary | ICD-10-CM | POA: Diagnosis not present

## 2021-11-16 ENCOUNTER — Telehealth: Payer: Self-pay | Admitting: Pulmonary Disease

## 2021-11-16 DIAGNOSIS — G4733 Obstructive sleep apnea (adult) (pediatric): Secondary | ICD-10-CM

## 2021-11-16 NOTE — Telephone Encounter (Signed)
Call patient ? ?Sleep study result ? ?Date of study: ?11/02/2021 ? ?Impression: ?Moderate obstructive sleep apnea ?Mild oxygen desaturations ? ?Recommendation: ?DME referral ? ?Recommend CPAP therapy for moderate obstructive sleep apnea ? ?Auto titrating CPAP with pressure settings of 5-15 will be appropriate ? ?Encourage weight loss measures ? ?Follow-up in the office 4 to 6 weeks following initiation of treatment ? ?

## 2021-11-17 NOTE — Telephone Encounter (Signed)
I called and spoke with the patient and he is agreeable to the CPAP and I have placed the order for the patient and he did not have any questions.  ?

## 2023-05-23 ENCOUNTER — Encounter: Payer: Self-pay | Admitting: Gastroenterology

## 2023-08-22 ENCOUNTER — Encounter: Payer: Self-pay | Admitting: Gastroenterology

## 2023-08-22 ENCOUNTER — Ambulatory Visit: Payer: BC Managed Care – PPO | Admitting: Gastroenterology

## 2023-08-22 VITALS — BP 118/80 | HR 85 | Ht 68.0 in | Wt 220.0 lb

## 2023-08-22 DIAGNOSIS — Z800 Family history of malignant neoplasm of digestive organs: Secondary | ICD-10-CM

## 2023-08-22 DIAGNOSIS — Z1211 Encounter for screening for malignant neoplasm of colon: Secondary | ICD-10-CM

## 2023-08-22 MED ORDER — SUFLAVE 178.7 G PO SOLR: 1.0000 | Freq: Once | ORAL | 0 refills | Status: AC

## 2023-08-22 NOTE — Progress Notes (Unsigned)
HPI :    Father 38 esophageal cancer Nonsmoker, nondrinker  Grandfather colon,  Greatgrandmother colon Father     Colonoscopy in July 2014 (Dr. Juanda Chance) Normal  EGD July 2014 (Dr. Juanda Chance) Normal esophagus, no hiatal hernia Mild to moderate gastritis, biopsied Normal duodenum  Biopsies of stomach with mild gastritis, no H. pylori.  GE junction biopsied, no evidence of Barrett's  Past Medical History:  Diagnosis Date   Anxiety    Arthritis    Chronic headaches    Gastric ulcer    Gastritis    GERD (gastroesophageal reflux disease)    HTN (hypertension)    Kidney stones    Neuromuscular disorder (HCC)    disc disease     Past Surgical History:  Procedure Laterality Date   LAMINECTOMY  2007, 1990   Family History  Problem Relation Age of Onset   Colon cancer Maternal Grandfather    Esophageal cancer Father    Heart disease Father    Diabetes Mother    Stroke Paternal Grandmother    Rectal cancer Neg Hx    Stomach cancer Neg Hx    Social History   Tobacco Use   Smoking status: Never   Smokeless tobacco: Current    Types: Snuff  Substance Use Topics   Alcohol use: No   Drug use: No   Current Outpatient Medications  Medication Sig Dispense Refill   allopurinol (ZYLOPRIM) 300 MG tablet Take 300 mg by mouth daily.     aspirin 81 MG tablet Take 81 mg by mouth daily.     b complex vitamins capsule Take 1 capsule by mouth daily.     budesonide-formoterol (SYMBICORT) 160-4.5 MCG/ACT inhaler Inhale 2 puffs into the lungs 2 (two) times daily.     calcium carbonate (TUMS EX) 750 MG chewable tablet Chew 1 tablet by mouth as needed for heartburn.     lisinopril-hydrochlorothiazide (ZESTORETIC) 20-12.5 MG tablet Take 1 tablet by mouth daily.     Multiple Vitamin (MULTIVITAMIN) tablet Take 1 tablet by mouth daily.     No current facility-administered medications for this visit.   No Known Allergies   Review of Systems: All systems reviewed and negative  except where noted in HPI.    No results found.  Physical Exam: There were no vitals taken for this visit. Constitutional: Pleasant,well-developed, ***male in no acute distress. HEENT: Normocephalic and atraumatic. Conjunctivae are normal. No scleral icterus. Neck supple.  Cardiovascular: Normal rate, regular rhythm.  Pulmonary/chest: Effort normal and breath sounds normal. No wheezing, rales or rhonchi. Abdominal: Soft, nondistended, nontender. Bowel sounds active throughout. There are no masses palpable. No hepatomegaly. Extremities: no edema Lymphadenopathy: No cervical adenopathy noted. Neurological: Alert and oriented to person place and time. Skin: Skin is warm and dry. No rashes noted. Psychiatric: Normal mood and affect. Behavior is normal.  CBC    Component Value Date/Time   WBC 6.1 04/07/2012 0907   RBC 4.90 04/07/2012 0907   HGB 15.1 04/07/2012 0907   HCT 43.8 04/07/2012 0907   PLT 175 04/07/2012 0907   MCV 90 04/07/2012 0907   MCH 30.8 04/07/2012 0907   MCHC 34.4 04/07/2012 0907   RDW 13.6 04/07/2012 0907    CMP     Component Value Date/Time   NA 140 04/07/2012 0907   K 3.8 04/07/2012 0907   CL 105 04/07/2012 0907   CO2 30 04/07/2012 0907   GLUCOSE 95 04/07/2012 0907   BUN 19 (H) 04/07/2012 0907   CREATININE 0.94  04/07/2012 0907   CALCIUM 9.1 04/07/2012 0907   PROT 7.4 04/07/2012 0907   ALBUMIN 4.1 04/07/2012 0907   AST 18 04/07/2012 0907   ALT 24 04/07/2012 0907   ALKPHOS 64 04/07/2012 0907   BILITOT 1.4 (H) 04/07/2012 0907   GFRNONAA >60 04/07/2012 0907   GFRAA >60 04/07/2012 0907       Latest Ref Rng & Units 04/07/2012    9:07 AM  CBC EXTENDED  WBC 3.8 - 10.6 x10 3/mm 3 6.1   RBC 4.40 - 5.90 x10 6/mm 3 4.90   Hemoglobin 13.0 - 18.0 g/dL 40.9   HCT 81.1 - 91.4 % 43.8   Platelets 150 - 440 x10 3/mm 3 175       ASSESSMENT AND PLAN:  Paulina Fusi, MD

## 2023-08-22 NOTE — Patient Instructions (Addendum)
You have been scheduled for an endoscopy and colonoscopy. Please follow the written instructions given to you at your visit today.  Please pick up your prep supplies at the pharmacy within the next 1-3 days.  If you use inhalers (even only as needed), please bring them with you on the day of your procedure.  DO NOT TAKE 7 DAYS PRIOR TO TEST- Trulicity (dulaglutide) Ozempic, Wegovy (semaglutide) Mounjaro (tirzepatide) Bydureon Bcise (exanatide extended release)  DO NOT TAKE 1 DAY PRIOR TO YOUR TEST Rybelsus (semaglutide) Adlyxin (lixisenatide) Victoza (liraglutide) Byetta (exanatide) ___________________________________________________________________________  Bonita Quin will receive your bowel preparation through [facility], which ensures the lowest copay and home delivery, with outreach via text or call from an [phone] number. Please respond promptly to avoid rescheduling. If you are interested in alternative options or have any questions please contact them at [phone]  Your Provider Has Sent Your Bowel Prep Regimen To [facility] What to expect. [facility] will contact you to verify your information and collect your copay, if applicable. Enjoy the comfort of your home while we deliver your prescription to you, free of any shipping charges. Fast, FREE delivery or shipping. [facility] accepts all major insurance benefits and applies discounts & coupons  Have additional questions? [facility]'s patient care team is always here to help.  Chat: [email or web address] Call: [phone] Email: [email or web address] NCPDP: [id number] How will we contact you? Welcome Phone call  a Welcome text and a Checkout link in a text Texts you receive from [phone] Are Not Spam.   *To set up delivery, you must complete the checkout process via link or speak to one of our patient care representatives. If we are unable to reach you, your prescription may be delayed.  To avoid waiting on hold if  you call. Utilize the secure chat feature and request [facility] call you to complete the transaction or expedite your concerns.  _______________________________________________________  If your blood pressure at your visit was 140/90 or greater, please contact your primary care physician to follow up on this.  _______________________________________________________  If you are age 32 or older, your body mass index should be between 23-30. Your Body mass index is 33.45 kg/m. If this is out of the aforementioned range listed, please consider follow up with your Primary Care Provider.  If you are age 47 or younger, your body mass index should be between 19-25. Your Body mass index is 33.45 kg/m. If this is out of the aformentioned range listed, please consider follow up with your Primary Care Provider.   ________________________________________________________  The [facility] providers would like to encourage you to use [facility] to communicate with providers for non-urgent requests or questions.  Due to long hold times on the telephone, sending your provider a message by [facility] may be a faster and more efficient way to get a response.  Please allow 48 business hours for a response.  Please remember that this is for non-urgent requests.  _______________________________________________________  Due to recent changes in healthcare laws, you may see the results of your imaging and laboratory studies on MyChart before your provider has had a chance to review them.  We understand that in some cases there may be results that are confusing or concerning to you. Not all laboratory results come back in the same time frame and the provider may be waiting for multiple results in order to interpret others.  Please give [place] 48 hours in order for your provider to thoroughly review all the results before contacting the office for clarification  of your results.   Thank you for entrusting me with your care and  choosing [facility].  Dr Tomasa Rand

## 2023-09-20 ENCOUNTER — Telehealth: Payer: Self-pay | Admitting: Gastroenterology

## 2023-09-20 NOTE — Telephone Encounter (Signed)
 Inbound call from patient stating that he is scheduled to have a colonoscopy and endoscopy on 3/21 and is requesting to speak with someone in pre cert to discuss questions he has. Patient states he has contacted his insurance and they advised him that they had received the request for the colonoscopy but not the endoscopy. Please advise.

## 2023-10-07 ENCOUNTER — Encounter: Payer: BC Managed Care – PPO | Admitting: Gastroenterology
